# Patient Record
Sex: Male | Born: 1955 | Race: Asian | Hispanic: No | Marital: Married
Health system: Southern US, Community
[De-identification: ages and names within clinical notes are randomized; demographics above are authoritative.]

---

## 2018-09-17 ENCOUNTER — Other Ambulatory Visit: Payer: Self-pay | Admitting: Physician Assistant

## 2018-09-17 ENCOUNTER — Ambulatory Visit
Admission: RE | Admit: 2018-09-17 | Discharge: 2018-09-17 | Disposition: A | Discharge status: Home / Self Care | Payer: BLUE CROSS/BLUE SHIELD | Source: Ambulatory Visit | Attending: Physician Assistant | Admitting: Physician Assistant

## 2018-09-17 DIAGNOSIS — R509 Fever, unspecified: Secondary | ICD-10-CM

## 2018-09-17 DIAGNOSIS — R059 Cough, unspecified: Secondary | ICD-10-CM

## 2018-09-17 DIAGNOSIS — R05 Cough: Secondary | ICD-10-CM

## 2019-06-17 DIAGNOSIS — N529 Male erectile dysfunction, unspecified: Secondary | ICD-10-CM | POA: Diagnosis not present

## 2019-06-17 DIAGNOSIS — E119 Type 2 diabetes mellitus without complications: Secondary | ICD-10-CM | POA: Diagnosis not present

## 2019-06-17 DIAGNOSIS — I10 Essential (primary) hypertension: Secondary | ICD-10-CM | POA: Diagnosis not present

## 2019-06-17 DIAGNOSIS — E782 Mixed hyperlipidemia: Secondary | ICD-10-CM | POA: Diagnosis not present

## 2019-07-01 DIAGNOSIS — E782 Mixed hyperlipidemia: Secondary | ICD-10-CM | POA: Diagnosis not present

## 2019-07-01 DIAGNOSIS — E119 Type 2 diabetes mellitus without complications: Secondary | ICD-10-CM | POA: Diagnosis not present

## 2019-12-22 DIAGNOSIS — I10 Essential (primary) hypertension: Secondary | ICD-10-CM | POA: Diagnosis not present

## 2019-12-22 DIAGNOSIS — E782 Mixed hyperlipidemia: Secondary | ICD-10-CM | POA: Diagnosis not present

## 2019-12-22 DIAGNOSIS — Z0000 Encounter for general adult medical examination without abnormal findings: Secondary | ICD-10-CM | POA: Diagnosis not present

## 2019-12-22 DIAGNOSIS — E119 Type 2 diabetes mellitus without complications: Secondary | ICD-10-CM | POA: Diagnosis not present

## 2020-06-29 DIAGNOSIS — I10 Essential (primary) hypertension: Secondary | ICD-10-CM | POA: Diagnosis not present

## 2020-06-29 DIAGNOSIS — E119 Type 2 diabetes mellitus without complications: Secondary | ICD-10-CM | POA: Diagnosis not present

## 2020-06-29 DIAGNOSIS — Z1211 Encounter for screening for malignant neoplasm of colon: Secondary | ICD-10-CM | POA: Diagnosis not present

## 2020-06-29 DIAGNOSIS — E782 Mixed hyperlipidemia: Secondary | ICD-10-CM | POA: Diagnosis not present

## 2020-07-03 DIAGNOSIS — Z1211 Encounter for screening for malignant neoplasm of colon: Secondary | ICD-10-CM | POA: Diagnosis not present

## 2020-09-18 IMAGING — CR DG CHEST 2V
2 series · 2 of 2 positions shown · non-contrast
Comparison: None.

CLINICAL DATA: Cough, fever.

EXAM:
CHEST - 2 VIEW

[w chest pa]
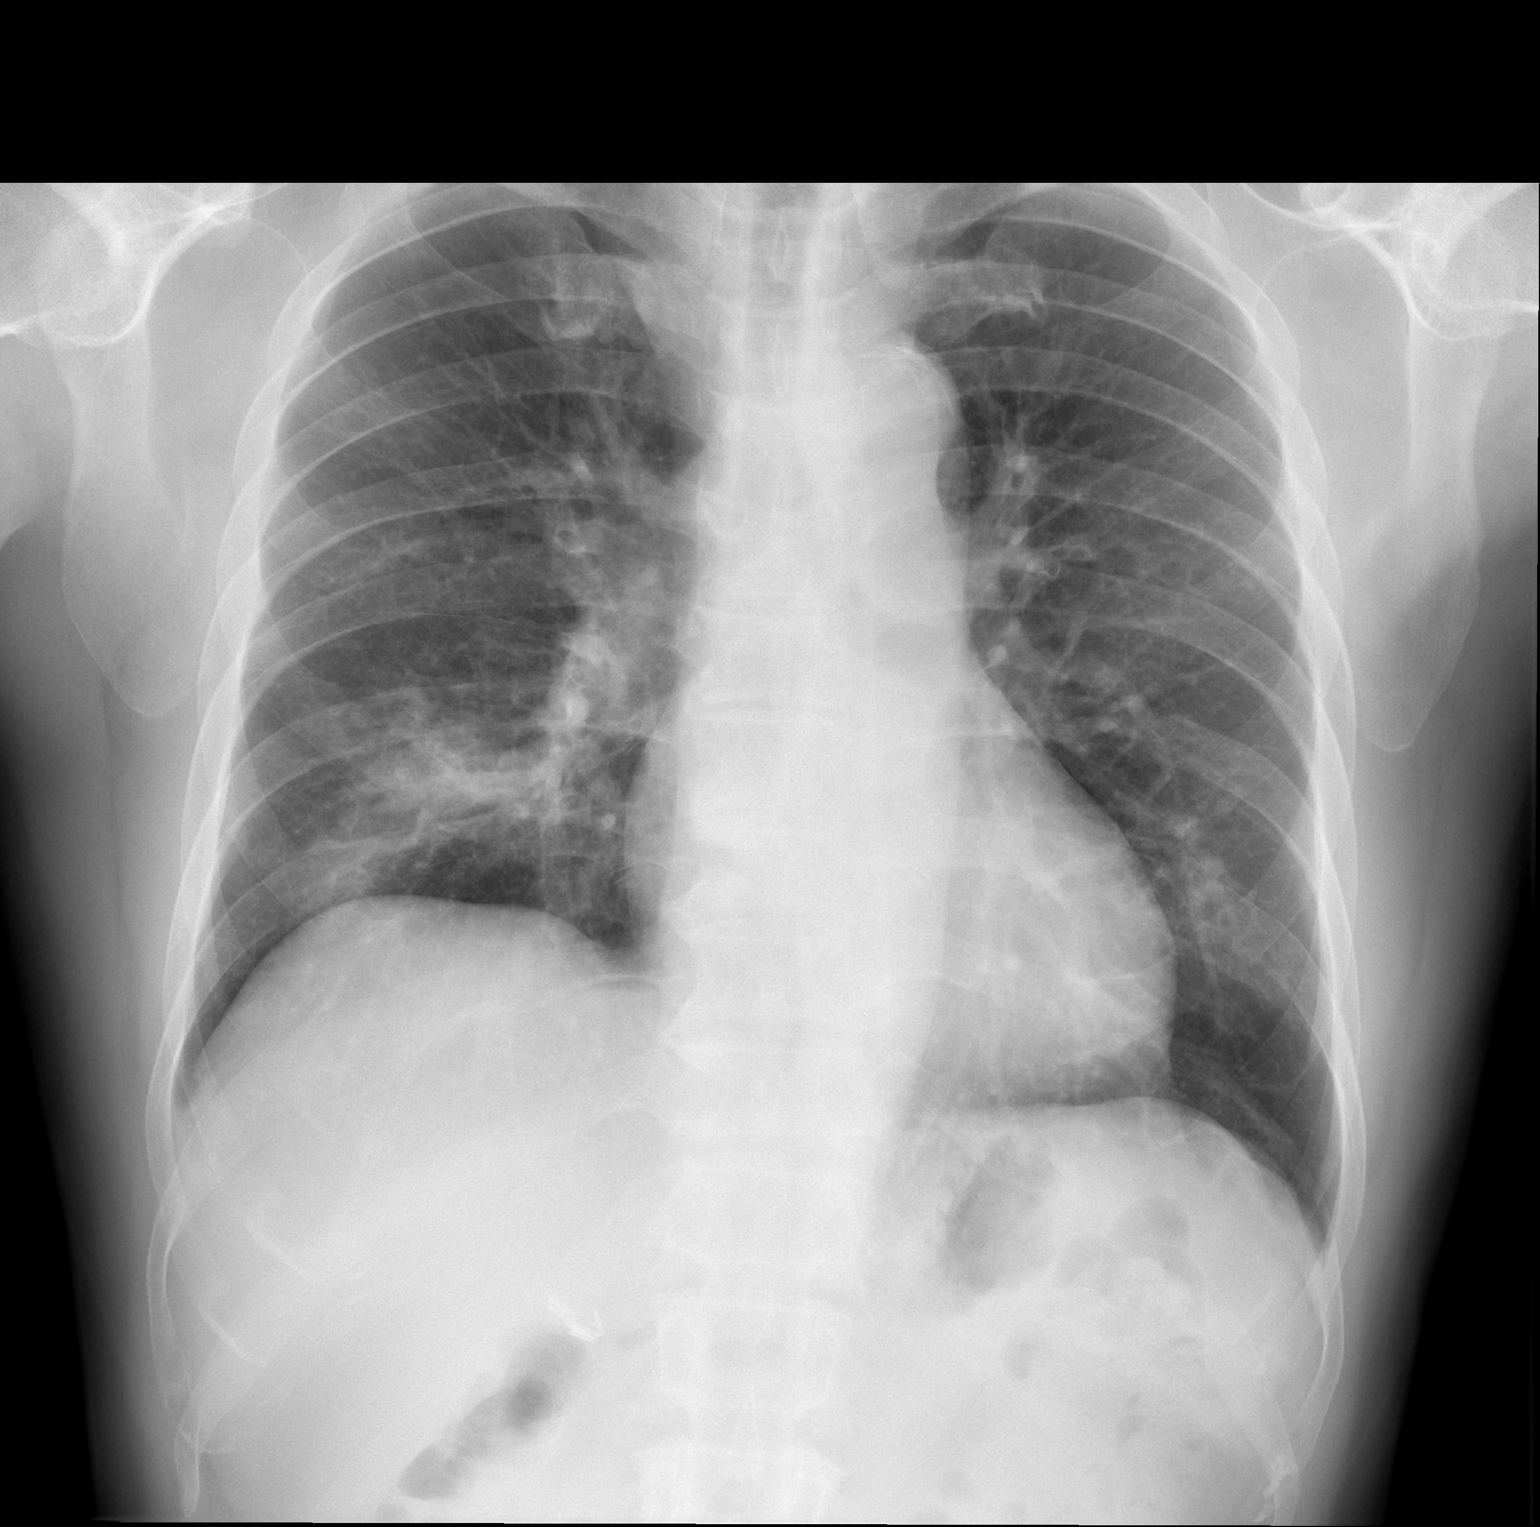

[w chest lat]
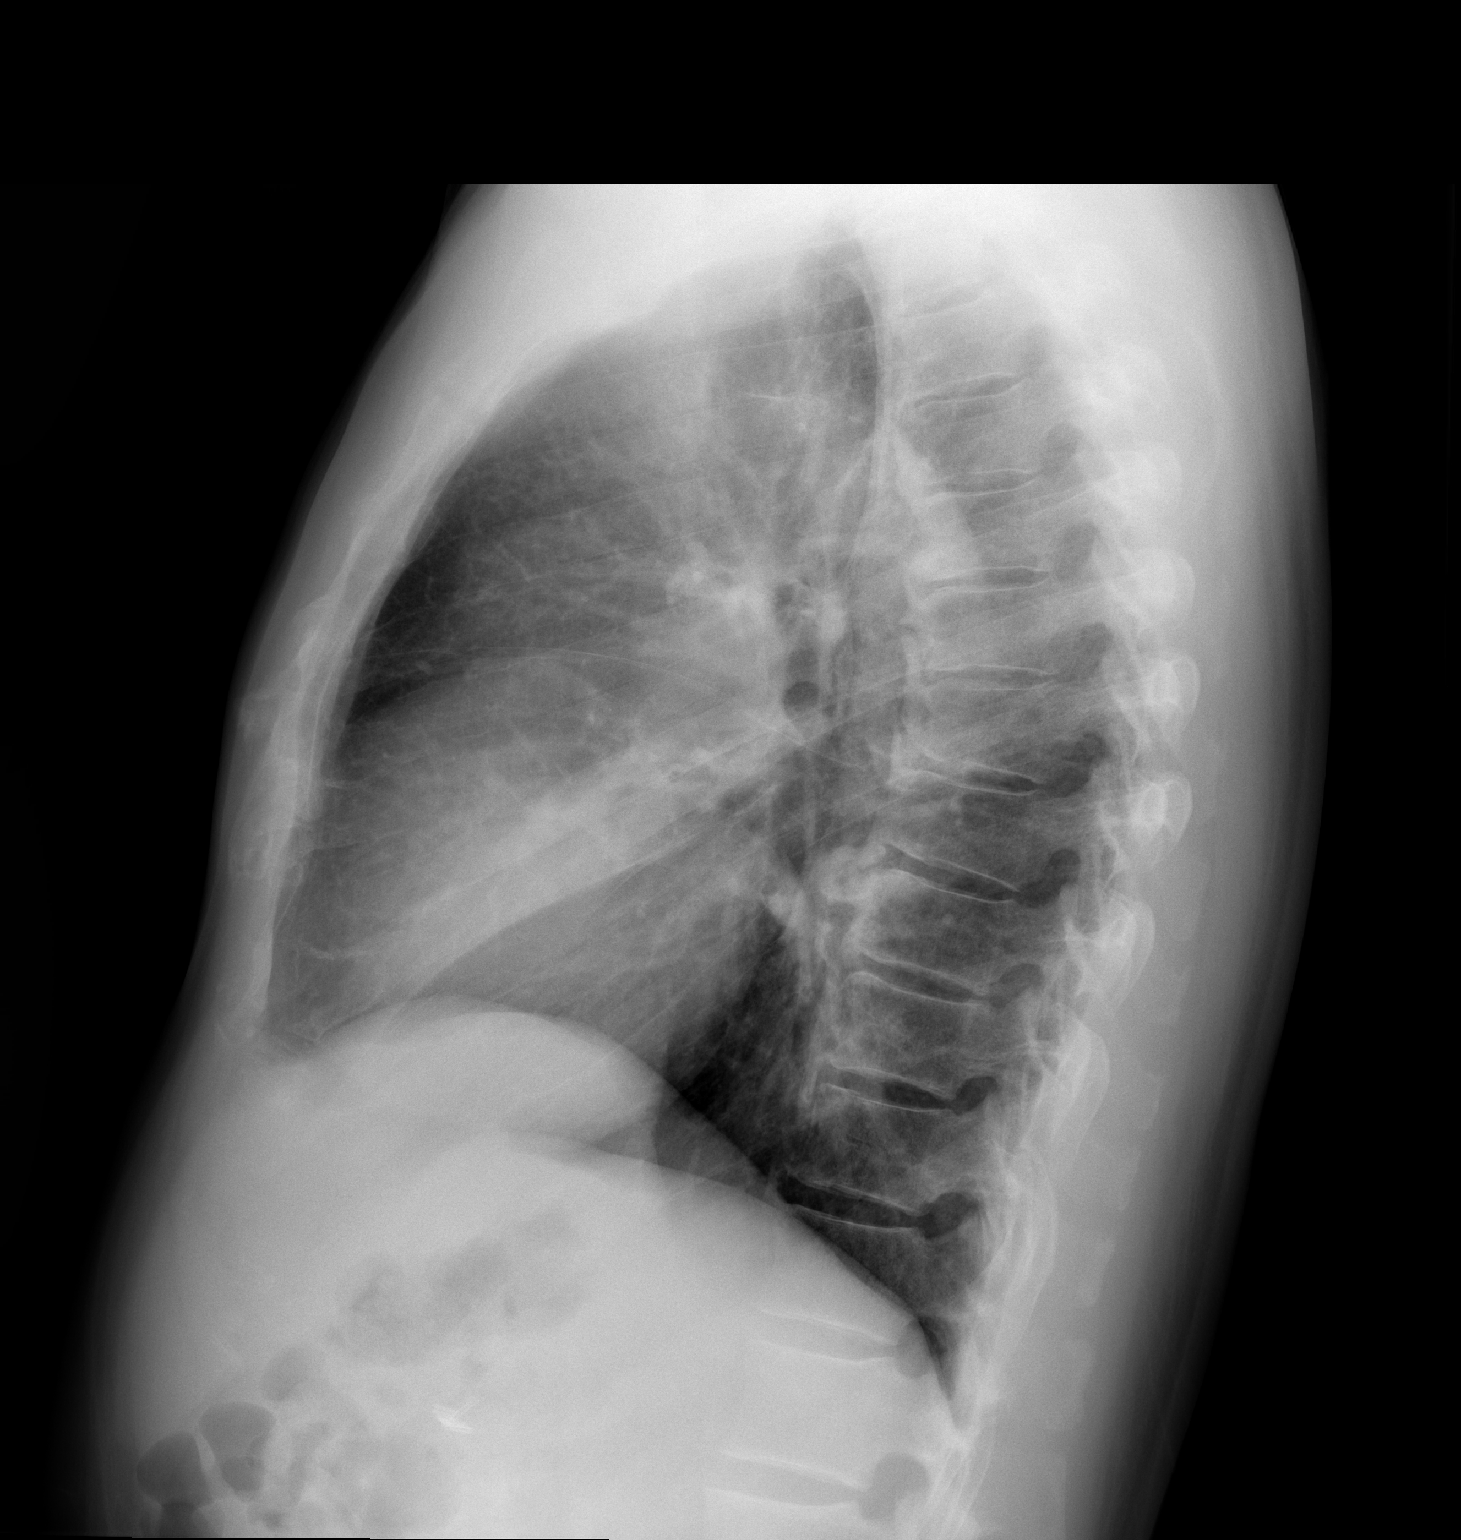

[2 of 2 positions shown; findings below may reference images not displayed]

FINDINGS: The heart size and mediastinal contours are within normal limits. No
pneumothorax or pleural effusion is noted. Left lung is clear. Right
middle lobe opacity is noted most consistent with pneumonia. The
visualized skeletal structures are unremarkable.
IMPRESSION: Right middle lobe opacity most consistent with pneumonia. Followup
PA and lateral chest X-ray is recommended in 3-4 weeks following
trial of antibiotic therapy to ensure resolution and exclude
underlying malignancy.

## 2020-12-31 DIAGNOSIS — E119 Type 2 diabetes mellitus without complications: Secondary | ICD-10-CM | POA: Diagnosis not present

## 2020-12-31 DIAGNOSIS — E782 Mixed hyperlipidemia: Secondary | ICD-10-CM | POA: Diagnosis not present

## 2020-12-31 DIAGNOSIS — Z0000 Encounter for general adult medical examination without abnormal findings: Secondary | ICD-10-CM | POA: Diagnosis not present

## 2020-12-31 DIAGNOSIS — I10 Essential (primary) hypertension: Secondary | ICD-10-CM | POA: Diagnosis not present

## 2021-07-12 DIAGNOSIS — E782 Mixed hyperlipidemia: Secondary | ICD-10-CM | POA: Diagnosis not present

## 2021-07-12 DIAGNOSIS — Z1211 Encounter for screening for malignant neoplasm of colon: Secondary | ICD-10-CM | POA: Diagnosis not present

## 2021-07-12 DIAGNOSIS — E1169 Type 2 diabetes mellitus with other specified complication: Secondary | ICD-10-CM | POA: Diagnosis not present

## 2021-07-12 DIAGNOSIS — I10 Essential (primary) hypertension: Secondary | ICD-10-CM | POA: Diagnosis not present

## 2021-07-16 DIAGNOSIS — Z1211 Encounter for screening for malignant neoplasm of colon: Secondary | ICD-10-CM | POA: Diagnosis not present

## 2021-12-09 ENCOUNTER — Encounter (HOSPITAL_COMMUNITY): Payer: Self-pay

## 2021-12-09 ENCOUNTER — Emergency Department (HOSPITAL_COMMUNITY)
Admission: EM | Admit: 2021-12-09 | Discharge: 2021-12-10 | Disposition: A | Discharge status: Home / Self Care | Payer: Medicare Other | Attending: Student | Admitting: Student

## 2021-12-09 DIAGNOSIS — R42 Dizziness and giddiness: Secondary | ICD-10-CM | POA: Diagnosis not present

## 2021-12-09 DIAGNOSIS — I10 Essential (primary) hypertension: Secondary | ICD-10-CM | POA: Diagnosis not present

## 2021-12-09 DIAGNOSIS — R0689 Other abnormalities of breathing: Secondary | ICD-10-CM | POA: Diagnosis not present

## 2021-12-09 DIAGNOSIS — R112 Nausea with vomiting, unspecified: Secondary | ICD-10-CM | POA: Diagnosis not present

## 2021-12-09 DIAGNOSIS — R110 Nausea: Secondary | ICD-10-CM | POA: Diagnosis not present

## 2021-12-09 LAB — COMPREHENSIVE METABOLIC PANEL
ALT: 23 U/L (ref 0–44)
AST: 22 U/L (ref 15–41)
Albumin: 4 g/dL (ref 3.5–5.0)
Alkaline Phosphatase: 61 U/L (ref 38–126)
Anion gap: 12 (ref 5–15)
BUN: 16 mg/dL (ref 8–23)
CO2: 22 mmol/L (ref 22–32)
Calcium: 9.2 mg/dL (ref 8.9–10.3)
Chloride: 102 mmol/L (ref 98–111)
Creatinine, Ser: 1.02 mg/dL (ref 0.61–1.24)
GFR, Estimated: >60 mL/min (ref >60)
Glucose, Bld: 235 mg/dL — ABNORMAL HIGH (ref 70–99)
Potassium: 3.6 mmol/L (ref 3.5–5.1)
Sodium: 136 mmol/L (ref 135–145)
Total Bilirubin: 1.1 mg/dL (ref 0.3–1.2)
Total Protein: 7.6 g/dL (ref 6.5–8.1)

## 2021-12-09 LAB — CBC WITH DIFFERENTIAL/PLATELET
Abs Immature Granulocytes: 0.04 10*3/uL (ref 0.00–0.07)
Basophils Absolute: 0.1 10*3/uL (ref 0.0–0.1)
Basophils Relative: 1 %
Eosinophils Absolute: 0 10*3/uL (ref 0.0–0.5)
Eosinophils Relative: 0 %
HCT: 47.6 % (ref 39.0–52.0)
Hemoglobin: 15.8 g/dL (ref 13.0–17.0)
Immature Granulocytes: 0 %
Lymphocytes Relative: 16 %
Lymphs Abs: 1.9 10*3/uL (ref 0.7–4.0)
MCH: 29.2 pg (ref 26.0–34.0)
MCHC: 33.2 g/dL (ref 30.0–36.0)
MCV: 88 fL (ref 80.0–100.0)
Monocytes Absolute: 0.3 10*3/uL (ref 0.1–1.0)
Monocytes Relative: 3 %
Neutro Abs: 9.5 10*3/uL — ABNORMAL HIGH (ref 1.7–7.7)
Neutrophils Relative %: 80 %
Platelets: 308 10*3/uL (ref 150–400)
RBC: 5.41 MIL/uL (ref 4.22–5.81)
RDW: 12.7 % (ref 11.5–15.5)
WBC: 11.8 10*3/uL — ABNORMAL HIGH (ref 4.0–10.5)
nRBC: 0 % (ref 0.0–0.2)

## 2021-12-09 LAB — LIPASE, BLOOD: Lipase: 28 U/L (ref 11–51)

## 2021-12-09 NOTE — ED Triage Notes (Signed)
Pt comes from home via [facility] for n/v weakness, PTA given 8mg  zofran with no relief. Pt speaks mandrin

## 2021-12-09 NOTE — ED Provider Triage Note (Signed)
Emergency Medicine Provider Triage Evaluation Note  Christian Moses , a 66 y.o. male  was evaluated in triage.  Pt complains of nausea/vomiting ongoing all day today.  He denies diarrhea, fever, sick contacts.  No abdominal pain.  Had 8mg  zofran with EMS without much relief.  States he feels generally weak now.  Review of Systems  Positive: Nausea, vomiting Negative: Fever, diarrhea  Physical Exam  BP (!) 172/86 (BP Location: Right Arm)    Pulse 72    Temp 97.7 F (36.5 C) (Oral)    Resp 14    SpO2 91%   Gen:   Awake, no distress   Resp:  Normal effort  MSK:   Moves extremities without difficulty  Other:  Abdomen soft, non-tender  Medical Decision Making  Medically screening exam initiated at 10:28 PM.  Appropriate orders placed.  Christian Moses was informed that the remainder of the evaluation will be completed by another provider, this initial triage assessment does not replace that evaluation, and the importance of remaining in the ED until their evaluation is complete.  N/V.  No abdominal tenderness.  Appears generally weak on exam without focal deficits.  EKG, labs.   Suanne Marker, PA-C 12/09/21 2231

## 2021-12-10 MED ORDER — MECLIZINE HCL 25 MG PO TABS: 25.0000 mg | ORAL_TABLET | Freq: Three times a day (TID) | ORAL | 0 refills | Status: AC | PRN

## 2021-12-10 NOTE — ED Provider Notes (Signed)
[facility] EMERGENCY DEPARTMENT Provider Note  CSN: [id number] Arrival date & time: 12/09/21 2221  Chief Complaint(s) Emesis  HPI Christian Moses is a 66 y.o. male who presents emergency department for evaluation of nausea, vomiting and dizziness.  Patient states that yesterday at lunch she had acute onset nausea, vomiting and vertigo.  EMS was called and EKG initially with no dysrhythmia.  Patient had no chest pain, shortness of breath, abdominal pain, fever, numbness, tingling, weakness or other systemic symptoms associated with his vomiting and vertigo.  Patient felt like the room was spinning.  He was given Zofran and brought to the emergency department lobby where he unfortunately waited for approximately 8 hours until my evaluation.  On my evaluation, patient symptoms have resolved.  He denies chest pain, shortness of breath, abdominal pain or nausea, vomiting or other systemic symptoms currently.   Emesis  Past Medical History History reviewed. No pertinent past medical history. There are no problems to display for this patient.  Home Medication(s) Prior to Admission medications   Not on File                                                                                                                                    Past Surgical History History reviewed. No pertinent surgical history. Family History No family history on file.  Social History   Allergies Patient has no known allergies.  Review of Systems Review of Systems  Gastrointestinal:  Positive for nausea and vomiting.  Neurological:  Positive for dizziness.   Physical Exam Vital Signs  I have reviewed the triage vital signs BP (!) 148/73 (BP Location: Left Arm)    Pulse (!) 59    Temp 97.7 F (36.5 C) (Oral)    Resp 14    SpO2 100%   Physical Exam Vitals and nursing note reviewed.  Constitutional:      General: He is not in acute distress.    Appearance: He is well-developed.  HENT:      Head: Normocephalic and atraumatic.  Eyes:     Conjunctiva/sclera: Conjunctivae normal.  Cardiovascular:     Rate and Rhythm: Normal rate and regular rhythm.     Heart sounds: No murmur heard. Pulmonary:     Effort: Pulmonary effort is normal. No respiratory distress.     Breath sounds: Normal breath sounds.  Abdominal:     Palpations: Abdomen is soft.     Tenderness: There is no abdominal tenderness.  Musculoskeletal:        General: No swelling.     Cervical back: Neck supple.  Skin:    General: Skin is warm and dry.     Capillary Refill: Capillary refill takes less than 2 seconds.  Neurological:     Mental Status: He is alert.  Psychiatric:        Mood and Affect: Mood normal.    ED Results and Treatments  Labs (all labs ordered are listed, but only abnormal results are displayed) Labs Reviewed  CBC WITH DIFFERENTIAL/PLATELET - Abnormal; Notable for the following components:      Result Value   WBC 11.8 (*)    Neutro Abs 9.5 (*)    All other components within normal limits  COMPREHENSIVE METABOLIC PANEL - Abnormal; Notable for the following components:   Glucose, Bld 235 (*)    All other components within normal limits  LIPASE, BLOOD  URINALYSIS, ROUTINE W REFLEX MICROSCOPIC                                                                                                                          Radiology No results found.  Pertinent labs & imaging results that were available during my care of the patient were reviewed by me and considered in my medical decision making (see MDM for details).  Medications Ordered in ED Medications - No data to display                                                                                                                                   Procedures Procedures  (including critical care time)  Medical Decision Making / ED Course   This patient presents to the ED for concern of vomiting, vertigo, this involves an extensive  number of treatment options, and is a complaint that carries with it a high risk of complications and morbidity.  The differential diagnosis includes BPPV, labyrinth-itis, central stroke, vestibular neuritis  MDM: The emergency department for evaluation of vomiting and vertigo.  Physical exam unremarkable.  Laboratory evaluation with mild hyperglycemia to 235 but normal gap and normal bicarb, no evidence of DKA.  Leukocytosis to 11.8 likely stress demargination in the setting of his vomiting.  Lipase unremarkable.  ECG nonischemic.  With normal neurologic exam, no persistent symptoms, low suspicion for central stroke.  Given acute onset nature of his vertigo that is fatigable, suspect peripheral vertigo.  Patient able to ambulate and tolerate p.o. in the emergency part without difficulty.  Thus he will be discharged with a prescription for meclizine as needed and he will need to follow-up with his primary care physician.   Additional history obtained: -External records from outside source obtained and reviewed including: Chart review including previous notes, labs, imaging, consultation notes   Lab Tests: -I ordered, reviewed, and interpreted labs.   The pertinent  results include:   Labs Reviewed  CBC WITH DIFFERENTIAL/PLATELET - Abnormal; Notable for the following components:      Result Value   WBC 11.8 (*)    Neutro Abs 9.5 (*)    All other components within normal limits  COMPREHENSIVE METABOLIC PANEL - Abnormal; Notable for the following components:   Glucose, Bld 235 (*)    All other components within normal limits  LIPASE, BLOOD  URINALYSIS, ROUTINE W REFLEX MICROSCOPIC      EKG   EKG Interpretation  Date/Time:  Monday December 09 2021 22:27:45 EST Ventricular Rate:  73 PR Interval:  180 QRS Duration: 88 QT Interval:  424 QTC Calculation: 467 R Axis:   88 Text Interpretation: Normal sinus rhythm Normal ECG No previous ECGs available Confirmed by Blanchie Dessert [phone])  on 12/10/2021 8:31:59 AM         Medicines ordered and prescription drug management: No orders of the defined types were placed in this encounter.   -I have reviewed the patients home medicines and have made adjustments as needed  Critical interventions none   Cardiac Monitoring: The patient was maintained on a cardiac monitor.  I personally viewed and interpreted the cardiac monitored which showed an underlying rhythm of: NSR  Social Determinants of Health:  Factors impacting patients care include: english second language    Reevaluation: After the interventions noted above, I reevaluated the patient and found that they have :improved  Co morbidities that complicate the patient evaluation History reviewed. No pertinent past medical history.    Dispostion: I considered admission for this patient, but as his symptoms have resolved and he is able to tolerate p.o. he does not meet inpatient criteria at this time and is safe for discharge.     Final Clinical Impression(s) / ED Diagnoses Final diagnoses:  None     @PCDICTATION @    Teressa Lower, MD 12/10/21 1004

## 2021-12-10 NOTE — ED Notes (Signed)
RN gave pt a sandwich and water

## 2022-01-24 DIAGNOSIS — I10 Essential (primary) hypertension: Secondary | ICD-10-CM | POA: Diagnosis not present

## 2022-01-24 DIAGNOSIS — E1169 Type 2 diabetes mellitus with other specified complication: Secondary | ICD-10-CM | POA: Diagnosis not present

## 2022-01-24 DIAGNOSIS — Z0000 Encounter for general adult medical examination without abnormal findings: Secondary | ICD-10-CM | POA: Diagnosis not present

## 2022-01-24 DIAGNOSIS — Z125 Encounter for screening for malignant neoplasm of prostate: Secondary | ICD-10-CM | POA: Diagnosis not present

## 2022-01-24 DIAGNOSIS — Z1159 Encounter for screening for other viral diseases: Secondary | ICD-10-CM | POA: Diagnosis not present

## 2022-01-24 DIAGNOSIS — E782 Mixed hyperlipidemia: Secondary | ICD-10-CM | POA: Diagnosis not present

## 2022-03-12 DIAGNOSIS — G8929 Other chronic pain: Secondary | ICD-10-CM | POA: Diagnosis not present

## 2022-03-12 DIAGNOSIS — M25562 Pain in left knee: Secondary | ICD-10-CM | POA: Diagnosis not present

## 2022-07-25 DIAGNOSIS — R059 Cough, unspecified: Secondary | ICD-10-CM | POA: Diagnosis not present

## 2022-07-25 DIAGNOSIS — J309 Allergic rhinitis, unspecified: Secondary | ICD-10-CM | POA: Diagnosis not present

## 2022-08-01 DIAGNOSIS — M1711 Unilateral primary osteoarthritis, right knee: Secondary | ICD-10-CM | POA: Diagnosis not present

## 2023-02-05 DIAGNOSIS — Z0000 Encounter for general adult medical examination without abnormal findings: Secondary | ICD-10-CM | POA: Diagnosis not present

## 2023-02-05 DIAGNOSIS — E1169 Type 2 diabetes mellitus with other specified complication: Secondary | ICD-10-CM | POA: Diagnosis not present

## 2023-02-05 DIAGNOSIS — Z1211 Encounter for screening for malignant neoplasm of colon: Secondary | ICD-10-CM | POA: Diagnosis not present

## 2023-02-05 DIAGNOSIS — Z23 Encounter for immunization: Secondary | ICD-10-CM | POA: Diagnosis not present

## 2023-02-05 DIAGNOSIS — Z125 Encounter for screening for malignant neoplasm of prostate: Secondary | ICD-10-CM | POA: Diagnosis not present

## 2023-02-05 DIAGNOSIS — E782 Mixed hyperlipidemia: Secondary | ICD-10-CM | POA: Diagnosis not present

## 2023-02-05 DIAGNOSIS — E1165 Type 2 diabetes mellitus with hyperglycemia: Secondary | ICD-10-CM | POA: Diagnosis not present

## 2023-02-05 DIAGNOSIS — I10 Essential (primary) hypertension: Secondary | ICD-10-CM | POA: Diagnosis not present

## 2023-02-09 DIAGNOSIS — Z1211 Encounter for screening for malignant neoplasm of colon: Secondary | ICD-10-CM | POA: Diagnosis not present

## 2023-03-13 DIAGNOSIS — E782 Mixed hyperlipidemia: Secondary | ICD-10-CM | POA: Diagnosis not present

## 2023-08-07 DIAGNOSIS — E782 Mixed hyperlipidemia: Secondary | ICD-10-CM | POA: Diagnosis not present

## 2023-08-07 DIAGNOSIS — E1165 Type 2 diabetes mellitus with hyperglycemia: Secondary | ICD-10-CM | POA: Diagnosis not present

## 2023-08-07 DIAGNOSIS — I10 Essential (primary) hypertension: Secondary | ICD-10-CM | POA: Diagnosis not present

## 2024-02-17 DIAGNOSIS — E782 Mixed hyperlipidemia: Secondary | ICD-10-CM | POA: Diagnosis not present

## 2024-02-17 DIAGNOSIS — E1169 Type 2 diabetes mellitus with other specified complication: Secondary | ICD-10-CM | POA: Diagnosis not present

## 2024-02-17 DIAGNOSIS — Z125 Encounter for screening for malignant neoplasm of prostate: Secondary | ICD-10-CM | POA: Diagnosis not present

## 2024-02-17 DIAGNOSIS — I10 Essential (primary) hypertension: Secondary | ICD-10-CM | POA: Diagnosis not present

## 2024-02-19 DIAGNOSIS — E1165 Type 2 diabetes mellitus with hyperglycemia: Secondary | ICD-10-CM | POA: Diagnosis not present

## 2024-02-19 DIAGNOSIS — Z0000 Encounter for general adult medical examination without abnormal findings: Secondary | ICD-10-CM | POA: Diagnosis not present

## 2024-02-19 DIAGNOSIS — I10 Essential (primary) hypertension: Secondary | ICD-10-CM | POA: Diagnosis not present

## 2024-02-19 DIAGNOSIS — E782 Mixed hyperlipidemia: Secondary | ICD-10-CM | POA: Diagnosis not present

## 2024-02-19 DIAGNOSIS — Z1211 Encounter for screening for malignant neoplasm of colon: Secondary | ICD-10-CM | POA: Diagnosis not present

## 2024-02-19 DIAGNOSIS — E1169 Type 2 diabetes mellitus with other specified complication: Secondary | ICD-10-CM | POA: Diagnosis not present

## 2024-03-11 DIAGNOSIS — I10 Essential (primary) hypertension: Secondary | ICD-10-CM | POA: Diagnosis not present

## 2024-05-27 DIAGNOSIS — E1169 Type 2 diabetes mellitus with other specified complication: Secondary | ICD-10-CM | POA: Diagnosis not present

## 2024-05-27 DIAGNOSIS — E782 Mixed hyperlipidemia: Secondary | ICD-10-CM | POA: Diagnosis not present

## 2024-05-27 DIAGNOSIS — I10 Essential (primary) hypertension: Secondary | ICD-10-CM | POA: Diagnosis not present
